# Patient Record
Sex: Male | Born: 1975 | Race: Black or African American | Hispanic: No | Marital: Married | Smoking: Current every day smoker
Health system: Southern US, Community
[De-identification: ages and names within clinical notes are randomized; demographics above are authoritative.]

---

## 2013-09-21 ENCOUNTER — Encounter (HOSPITAL_COMMUNITY): Payer: Self-pay | Admitting: Emergency Medicine

## 2013-09-21 ENCOUNTER — Emergency Department (HOSPITAL_COMMUNITY): Payer: Self-pay

## 2013-09-21 ENCOUNTER — Emergency Department (HOSPITAL_COMMUNITY)
Admission: EM | Admit: 2013-09-21 | Discharge: 2013-09-21 | Disposition: A | Discharge status: Home / Self Care | Payer: Self-pay | Attending: Emergency Medicine | Admitting: Emergency Medicine

## 2013-09-21 DIAGNOSIS — R0602 Shortness of breath: Secondary | ICD-10-CM | POA: Insufficient documentation

## 2013-09-21 DIAGNOSIS — R55 Syncope and collapse: Secondary | ICD-10-CM | POA: Insufficient documentation

## 2013-09-21 DIAGNOSIS — R5383 Other fatigue: Secondary | ICD-10-CM

## 2013-09-21 DIAGNOSIS — R42 Dizziness and giddiness: Secondary | ICD-10-CM | POA: Insufficient documentation

## 2013-09-21 DIAGNOSIS — R110 Nausea: Secondary | ICD-10-CM | POA: Insufficient documentation

## 2013-09-21 DIAGNOSIS — F17200 Nicotine dependence, unspecified, uncomplicated: Secondary | ICD-10-CM | POA: Insufficient documentation

## 2013-09-21 DIAGNOSIS — IMO0001 Reserved for inherently not codable concepts without codable children: Secondary | ICD-10-CM | POA: Insufficient documentation

## 2013-09-21 DIAGNOSIS — R5381 Other malaise: Secondary | ICD-10-CM | POA: Insufficient documentation

## 2013-09-21 LAB — URINALYSIS, ROUTINE W REFLEX MICROSCOPIC
Bilirubin Urine: NEGATIVE
Glucose, UA: NEGATIVE mg/dL
Hgb urine dipstick: NEGATIVE
KETONES UR: NEGATIVE mg/dL
Leukocytes, UA: NEGATIVE
Nitrite: NEGATIVE
Protein, ur: NEGATIVE mg/dL
Specific Gravity, Urine: 1.007 (ref 1.005–1.030)
Urobilinogen, UA: 0.2 mg/dL (ref 0.0–1.0)
pH: 7 (ref 5.0–8.0)

## 2013-09-21 LAB — CBC WITH DIFFERENTIAL/PLATELET
BASOS PCT: 1 % (ref 0–1)
Basophils Absolute: 0 10*3/uL (ref 0.0–0.1)
EOS PCT: 2 % (ref 0–5)
Eosinophils Absolute: 0.1 10*3/uL (ref 0.0–0.7)
HEMATOCRIT: 44.9 % (ref 39.0–52.0)
HEMOGLOBIN: 15.8 g/dL (ref 13.0–17.0)
Lymphocytes Relative: 26 % (ref 12–46)
Lymphs Abs: 1.7 10*3/uL (ref 0.7–4.0)
MCH: 31.8 pg (ref 26.0–34.0)
MCHC: 35.2 g/dL (ref 30.0–36.0)
MCV: 90.3 fL (ref 78.0–100.0)
MONO ABS: 0.4 10*3/uL (ref 0.1–1.0)
MONOS PCT: 7 % (ref 3–12)
Neutro Abs: 4.2 10*3/uL (ref 1.7–7.7)
Neutrophils Relative %: 65 % (ref 43–77)
Platelets: 231 10*3/uL (ref 150–400)
RBC: 4.97 MIL/uL (ref 4.22–5.81)
RDW: 12 % (ref 11.5–15.5)
WBC: 6.5 10*3/uL (ref 4.0–10.5)

## 2013-09-21 LAB — COMPREHENSIVE METABOLIC PANEL
ALBUMIN: 4.4 g/dL (ref 3.5–5.2)
ALT: 75 U/L — ABNORMAL HIGH (ref 0–53)
AST: 40 U/L — ABNORMAL HIGH (ref 0–37)
Alkaline Phosphatase: 68 U/L (ref 39–117)
BILIRUBIN TOTAL: 0.6 mg/dL (ref 0.3–1.2)
BUN: 11 mg/dL (ref 6–23)
CHLORIDE: 100 meq/L (ref 96–112)
CO2: 25 mEq/L (ref 19–32)
CREATININE: 1.11 mg/dL (ref 0.50–1.35)
Calcium: 9.7 mg/dL (ref 8.4–10.5)
GFR calc Af Amer: >90 mL/min (ref >90)
GFR calc non Af Amer: 83 mL/min — ABNORMAL LOW (ref >90)
Glucose, Bld: 107 mg/dL — ABNORMAL HIGH (ref 70–99)
Potassium: 3.6 mEq/L — ABNORMAL LOW (ref 3.7–5.3)
Sodium: 138 mEq/L (ref 137–147)
Total Protein: 7.4 g/dL (ref 6.0–8.3)

## 2013-09-21 LAB — TROPONIN I

## 2013-09-21 MED ORDER — LORAZEPAM 2 MG/ML IJ SOLN
1.0000 mg | Freq: Once | INTRAMUSCULAR | Status: AC
  Administered 2013-09-21: 1 mg via INTRAVENOUS
  Filled 2013-09-21: qty 1

## 2013-09-21 MED ORDER — ACETAMINOPHEN 500 MG PO TABS
1000.0000 mg | ORAL_TABLET | Freq: Once | ORAL | Status: AC
  Administered 2013-09-21: 1000 mg via ORAL
  Filled 2013-09-21: qty 2

## 2013-09-21 MED ORDER — MECLIZINE HCL 25 MG PO TABS: 25.0000 mg | ORAL_TABLET | Freq: Three times a day (TID) | ORAL | Status: AC | PRN

## 2013-09-21 MED ORDER — DIAZEPAM 5 MG/ML IJ SOLN
5.0000 mg | Freq: Once | INTRAMUSCULAR | Status: AC
  Administered 2013-09-21: 5 mg via INTRAVENOUS
  Filled 2013-09-21: qty 2

## 2013-09-21 MED ORDER — KETOROLAC TROMETHAMINE 30 MG/ML IJ SOLN
30.0000 mg | Freq: Once | INTRAMUSCULAR | Status: AC
  Administered 2013-09-21: 30 mg via INTRAVENOUS
  Filled 2013-09-21: qty 1

## 2013-09-21 MED ORDER — SODIUM CHLORIDE 0.9 % IV BOLUS (SEPSIS)
1000.0000 mL | Freq: Once | INTRAVENOUS | Status: AC
  Administered 2013-09-21: 1000 mL via INTRAVENOUS

## 2013-09-21 MED ORDER — DIAZEPAM 5 MG PO TABS: 5.0000 mg | ORAL_TABLET | Freq: Four times a day (QID) | ORAL | Status: AC | PRN

## 2013-09-21 NOTE — Discharge Instructions (Signed)
Near-Syncope Near-syncope (commonly known as near fainting) is sudden weakness, dizziness, or feeling like you might pass out. During an episode of near-syncope, you may also develop pale skin, have tunnel vision, or feel sick to your stomach (nauseous). Near-syncope may occur when getting up after sitting or while standing for a long time. It is caused by a sudden decrease in blood flow to the brain. This decrease can result from various causes or triggers, most of which are not serious. However, because near-syncope can sometimes be a sign of something serious, a medical evaluation is required. The specific cause is often not determined. HOME CARE INSTRUCTIONS  Monitor your condition for any changes. The following actions may help to alleviate any discomfort you are experiencing:  Have someone stay with you until you feel stable.  Lie down right away if you start feeling like you might faint. Breathe deeply and steadily. Wait until all the symptoms have passed. Most of these episodes last only a few minutes. You may feel tired for several hours.   Drink enough fluids to keep your urine clear or pale yellow.   If you are taking blood pressure or heart medicine, get up slowly when seated or lying down. Take several minutes to sit and then stand. This can reduce dizziness.  Follow up with your health care provider as directed. SEEK IMMEDIATE MEDICAL CARE IF:   You have a severe headache.   You have unusual pain in the chest, abdomen, or back.   You are bleeding from the mouth or rectum, or you have black or tarry stool.   You have an irregular or very fast heartbeat.   You have repeated fainting or have seizure-like jerking during an episode.   You faint when sitting or lying down.   You have confusion.   You have difficulty walking.   You have severe weakness.   You have vision problems.  MAKE SURE YOU:   Understand these instructions.  Will watch your  condition.  Will get help right away if you are not doing well or get worse. Document Released: 06/09/2005 Document Revised: 02/09/2013 Document Reviewed: 11/12/2012 [facility] Patient Information 2014 [address], [place].  Vertigo Vertigo means you feel like you or your surroundings are moving when they are not. Vertigo can be dangerous if it occurs when you are at work, driving, or performing difficult activities.  CAUSES  Vertigo occurs when there is a conflict of signals sent to your brain from the visual and sensory systems in your body. There are many different causes of vertigo, including:  Infections, especially in the inner ear.  A bad reaction to a drug or misuse of alcohol and medicines.  Withdrawal from drugs or alcohol.  Rapidly changing positions, such as lying down or rolling over in bed.  A migraine headache.  Decreased blood flow to the brain.  Increased pressure in the brain from a head injury, infection, tumor, or bleeding. SYMPTOMS  You may feel as though the world is spinning around or you are falling to the ground. Because your balance is upset, vertigo can cause nausea and vomiting. You may have involuntary eye movements (nystagmus). DIAGNOSIS  Vertigo is usually diagnosed by physical exam. If the cause of your vertigo is unknown, your caregiver may perform imaging tests, such as an MRI scan (magnetic resonance imaging). TREATMENT  Most cases of vertigo resolve on their own, without treatment. Depending on the cause, your caregiver may prescribe certain medicines. If your vertigo is related to body  position issues, your caregiver may recommend movements or procedures to correct the problem. In rare cases, if your vertigo is caused by certain inner ear problems, you may need surgery. HOME CARE INSTRUCTIONS   Follow your caregiver's instructions.  Avoid driving.  Avoid operating heavy machinery.  Avoid performing any tasks that would be dangerous to you or others  during a vertigo episode.  Tell your caregiver if you notice that certain medicines seem to be causing your vertigo. Some of the medicines used to treat vertigo episodes can actually make them worse in some people. SEEK IMMEDIATE MEDICAL CARE IF:   Your medicines do not relieve your vertigo or are making it worse.  You develop problems with talking, walking, weakness, or using your arms, hands, or legs.  You develop severe headaches.  Your nausea or vomiting continues or gets worse.  You develop visual changes.  A family member notices behavioral changes.  Your condition gets worse. MAKE SURE YOU:  Understand these instructions.  Will watch your condition.  Will get help right away if you are not doing well or get worse. Document Released: 03/19/2005 Document Revised: 09/01/2011 Document Reviewed: 12/26/2010 [facility] Patient Information 2014 [place], [place].

## 2013-09-21 NOTE — ED Notes (Addendum)
Per pt report: [place] started to feel achy and nauseous. Pt reports he wanted to pass out.  Pt is unsure if he passed out or not.  Pt is dizzy right now and claims that it feels like the pt is spinning.  Pt a/o x 4.  Skin warm and dry. Pt denies SOB or fever.  Pt endorses chills and nausea. Pt reports using marijuana today.

## 2013-09-21 NOTE — ED Notes (Signed)
Pt. Complains of dizzy. pt. Advised to stay in bed.

## 2013-09-21 NOTE — Progress Notes (Signed)
   CARE MANAGEMENT ED NOTE 09/21/2013  Patient:  Matthew Choi,Matthew Choi   Account Number:  [id number]  Date Initiated:  09/21/2013  Documentation initiated by:  Radford PaxFERRERO,Johari Pinney  Subjective/Objective Assessment:   Patient presents to Ed c/o nausea and dizzy     Subjective/Objective Assessment Detail:     Action/Plan:   Action/Plan Detail:   Anticipated DC Date:       Status Recommendation to Physician:   Result of Recommendation:    Other ED Services  Consult Working Plan    DC Planning Services  Other  PCP issues    Choice offered to / List presented to:            Status of service:  Completed, signed off  ED Comments:   ED Comments Detail:  EDCM spoke to patient at bedside.  Patient reports [place] not have insurance [place] [facility] [place]  Patient reports his pcp is Dr. Ether GriffinsFowler.  [facility] provided patient with phone number for Affordable Care Act to inquire about insurnace.  Also provided patient with RX discount card and list of discounted pharmacies.  Patient thankful for resources.  No furhter EDCM needs at this time.

## 2013-09-24 NOTE — ED Provider Notes (Signed)
CSN: [id number]     Arrival date & time 09/21/13  1946 History   First MD Initiated Contact with Patient 09/21/13 2009     Chief Complaint  Patient presents with  . Near Syncope  . Nausea     (Consider location/radiation/quality/duration/timing/severity/associated sxs/prior Treatment) HPI Comments: Was driving and felt like he was going to pass out. Felt flushed, then tingling all over. Became very dizzy, felt short of breath. Now has aching pain in all his muscles. No chest pain.   Patient is a 38 y.o. male presenting with near-syncope.  Near Syncope Associated symptoms include shortness of breath. Pertinent negatives include no chest pain.    History reviewed. No pertinent past medical history. History reviewed. No pertinent past surgical history. No family history on file. History  Substance Use Topics  . Smoking status: Current Every Day Smoker    Types: Cigarettes  . Smokeless tobacco: Not on file  . Alcohol Use: 4.0 oz/week    8 drink(s) per week    Review of Systems  Constitutional: Positive for fatigue.  Respiratory: Positive for shortness of breath.   Cardiovascular: Positive for near-syncope. Negative for chest pain.  Musculoskeletal: Positive for myalgias.  Neurological: Positive for dizziness.  All other systems reviewed and are negative.      Allergies  Review of patient's allergies indicates no known allergies.  Home Medications   Current Outpatient Rx  Name  Route  Sig  Dispense  Refill  . guaiFENesin (MUCINEX) 600 MG 12 hr tablet   Oral   Take by mouth 2 (two) times daily as needed (cough).         . ondansetron (ZOFRAN) 4 MG tablet   Oral   Take 4 mg by mouth every 8 (eight) hours as needed for nausea or vomiting (nausea).         . diazepam (VALIUM) 5 MG tablet   Oral   Take 1 tablet (5 mg total) by mouth every 6 (six) hours as needed for anxiety (spasms).   10 tablet   0   . meclizine (ANTIVERT) 25 MG tablet   Oral   Take 1 tablet  (25 mg total) by mouth 3 (three) times daily as needed for dizziness.   30 tablet   0    BP 115/64  Pulse 70  Temp(Src) 98.1 F (36.7 C) (Oral)  Resp 16  SpO2 97% Physical Exam  Constitutional: He is oriented to person, place, and time. He appears well-developed and well-nourished. No distress.  HENT:  Head: Normocephalic and atraumatic.  Right Ear: Hearing normal.  Left Ear: Hearing normal.  Nose: Nose normal.  Mouth/Throat: Oropharynx is clear and moist and mucous membranes are normal.  Eyes: Conjunctivae and EOM are normal. Pupils are equal, round, and reactive to light.  Neck: Normal range of motion. Neck supple.  Cardiovascular: Regular rhythm, S1 normal and S2 normal.  Exam reveals no gallop and no friction rub.   No murmur heard. Pulmonary/Chest: Effort normal and breath sounds normal. No respiratory distress. He exhibits no tenderness.  Abdominal: Soft. Normal appearance and bowel sounds are normal. There is no hepatosplenomegaly. There is no tenderness. There is no rebound, no guarding, no tenderness at McBurney's point and negative Murphy's sign. No hernia.  Musculoskeletal: Normal range of motion.  Neurological: He is alert and oriented to person, place, and time. He has normal strength. No cranial nerve deficit or sensory deficit. Coordination normal. GCS eye subscore is 4. GCS verbal subscore is 5. GCS  motor subscore is 6.  Skin: Skin is warm, dry and intact. No rash noted. No cyanosis.  Psychiatric: He has a normal mood and affect. His speech is normal and behavior is normal. Thought content normal.    ED Course  Procedures (including critical care time) Labs Review Labs Reviewed  COMPREHENSIVE METABOLIC PANEL - Abnormal; Notable for the following:    Potassium 3.6 (*)    Glucose, Bld 107 (*)    AST 40 (*)    ALT 75 (*)    GFR calc non Af Amer 83 (*)    All other components within normal limits  CBC WITH DIFFERENTIAL  TROPONIN I  URINALYSIS, ROUTINE W REFLEX  MICROSCOPIC   Imaging Review No results found.   EKG Interpretation   Date/Time:  Wednesday September 21 2013 19:54:14 EDT Ventricular Rate:  76 PR Interval:  172 QRS Duration: 92 QT Interval:  398 QTC Calculation: 447 R Axis:   -12 Text Interpretation:  Sinus rhythm Probable left atrial enlargement No  previous tracing Confirmed by POLLINA  MD, CHRISTOPHER [phone]) on 09/21/2013  8:10:05 PM      MDM   Final diagnoses:  Vertigo  Near syncope   Patient with near syncope. Sounds convincing for panic attack. Patient extremely anxious in ER, improved with valium. Cardiac workup normal. CT head normal. Normal neuro exam. Vitals normal, no shortness of breath currently. PE felt to be unlikely. Discussed possibility of neuro etiology (seizure, MS) and need for PCP follow up.    Gilda Creasehristopher J. Pollina, MD 09/24/13 [phone]

## 2015-06-19 IMAGING — CR DG CHEST 2V
2 series · 2 of 2 positions shown · non-contrast
Comparison: None.

CLINICAL DATA: Fever, chest pain

EXAM:
CHEST  2 VIEW

[w chest pa]
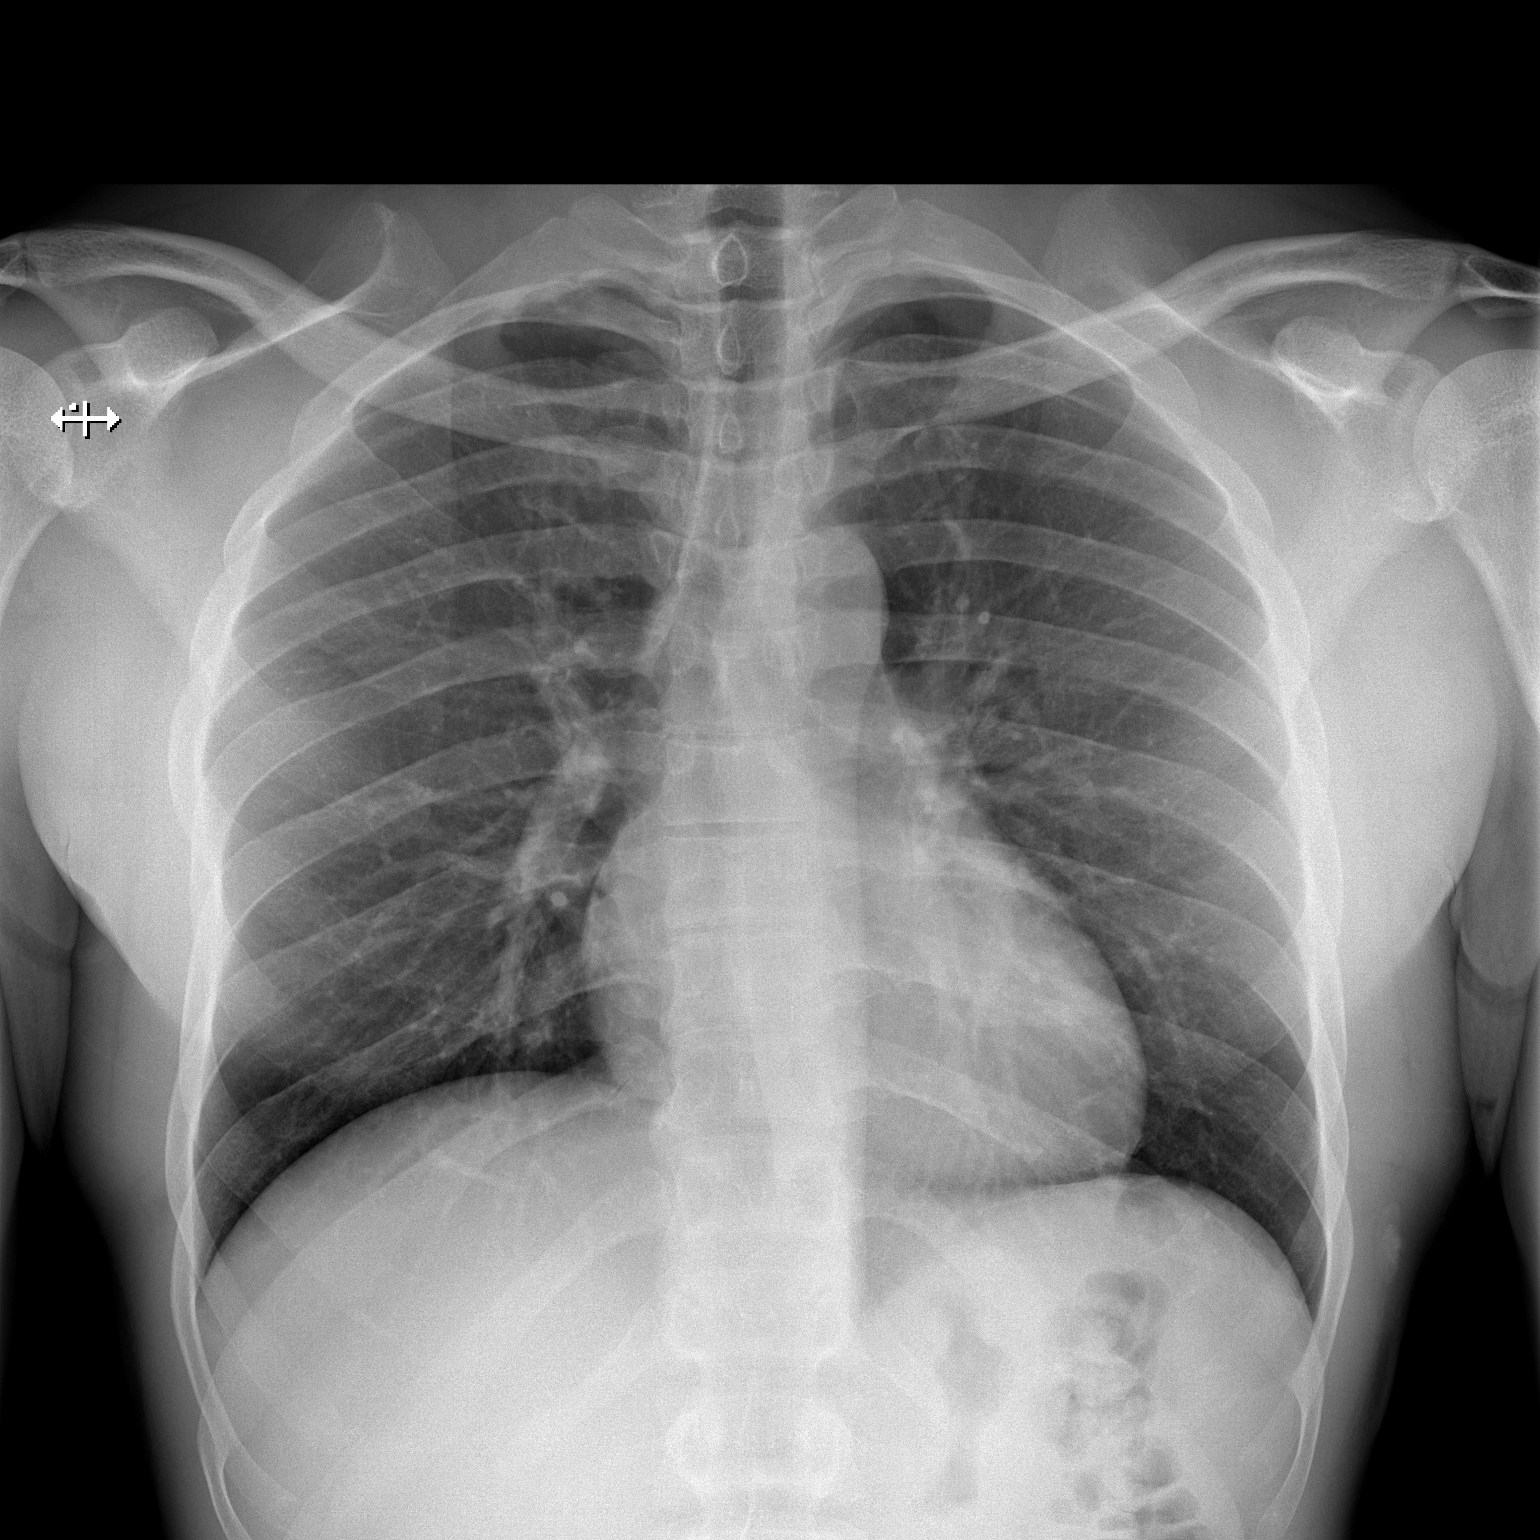

[w chest lat]
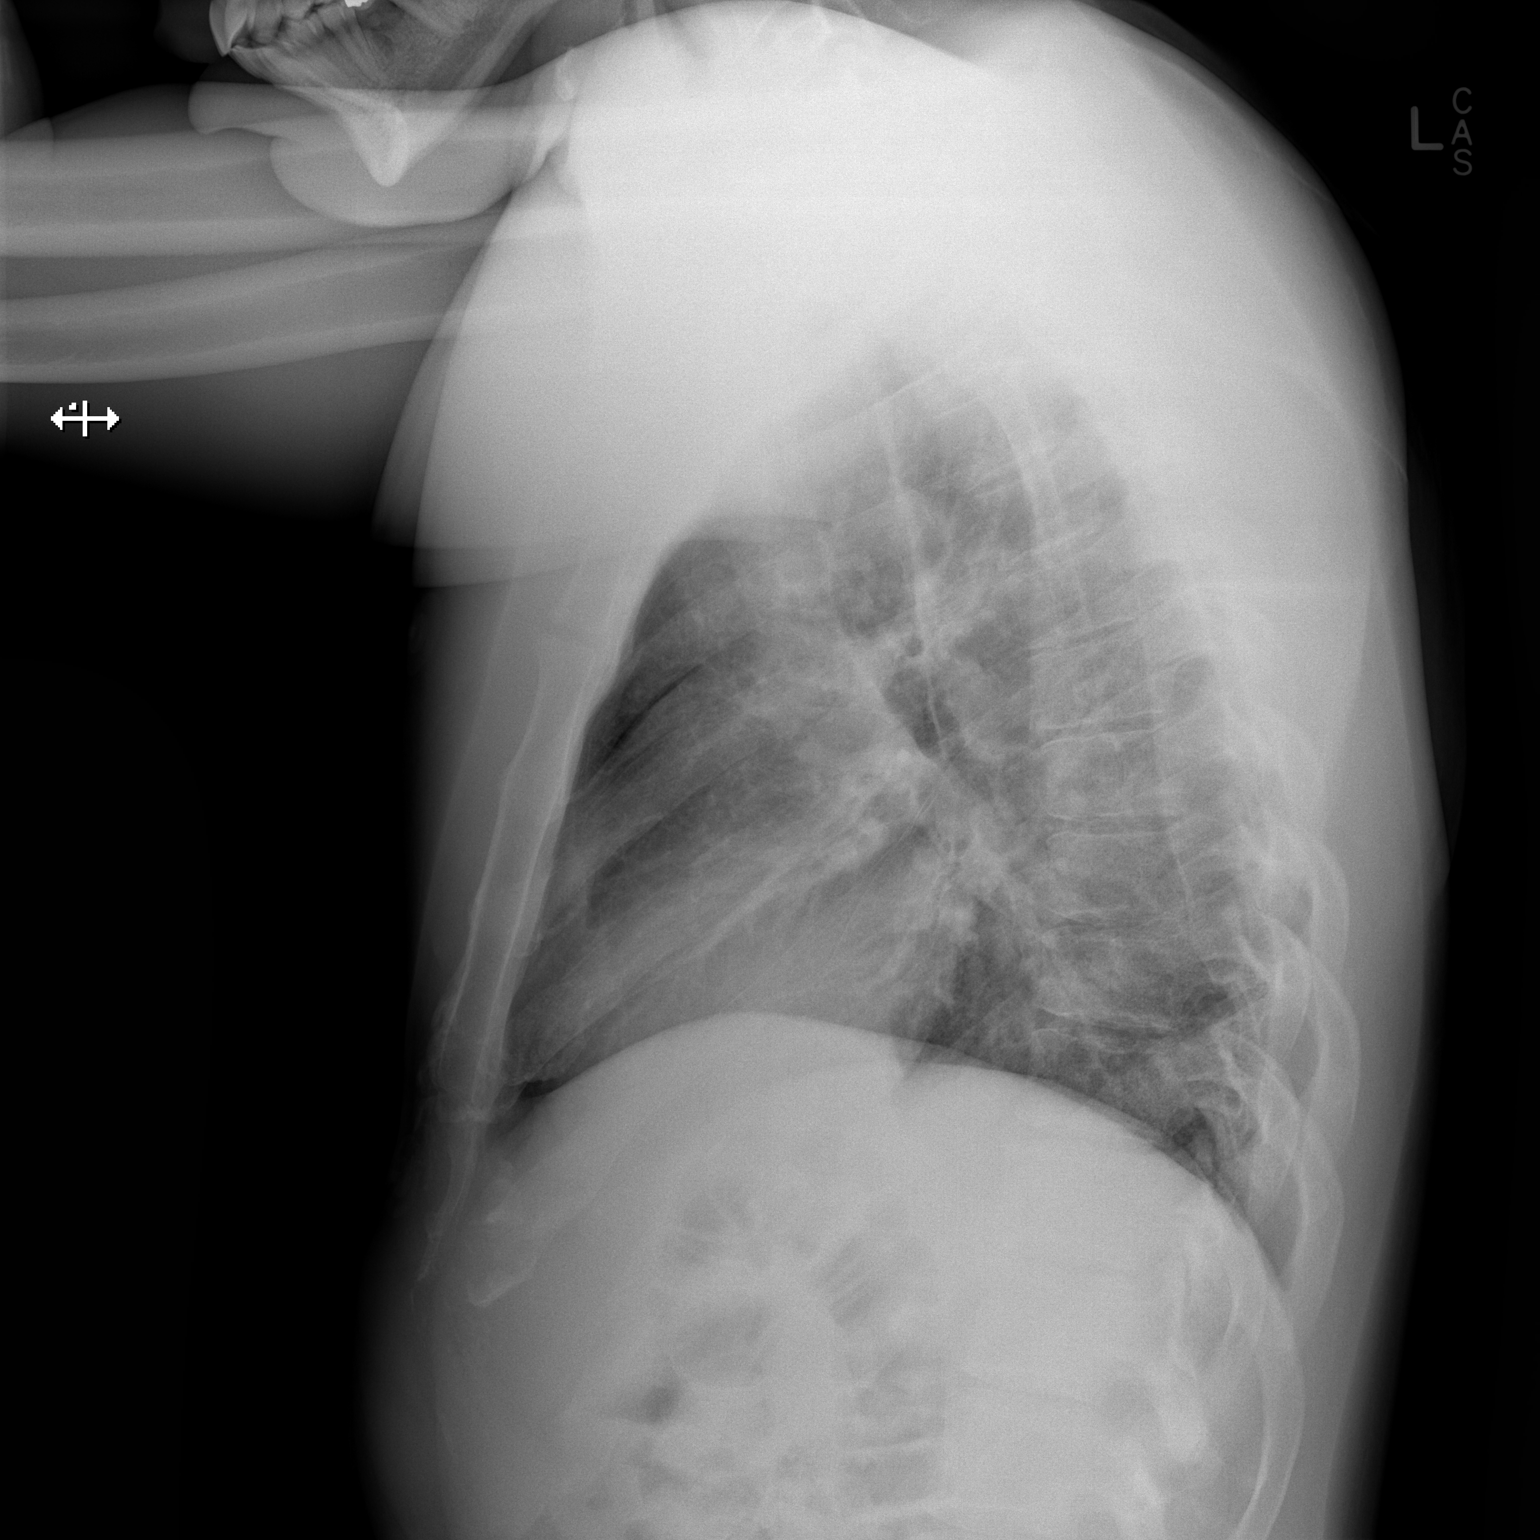

[2 of 2 positions shown; findings below may reference images not displayed]

FINDINGS: The lungs are clear and negative for focal airspace consolidation,
pulmonary edema or suspicious pulmonary nodule. No pleural effusion
or pneumothorax. Cardiac and mediastinal contours are within normal
limits. No acute fracture or lytic or blastic osseous lesions. The
visualized upper abdominal bowel gas pattern is unremarkable.
IMPRESSION: No active cardiopulmonary disease.

## 2015-06-19 IMAGING — CT CT HEAD W/O CM
2 series · 15 of 30 positions shown, 19 images · non-contrast
Comparison: None.

CLINICAL DATA: Dizziness and nausea.  Question of syncope.

EXAM:
CT HEAD WITHOUT CONTRAST
TECHNIQUE: Contiguous axial images were obtained from the base of the skull
through the vertex without intravenous contrast.

[Series 2: head w/o · axial · non-contrast · 0.48mm/px · z∈[-141,-11]mm · 13 of 32 slices shown, 17 images]
[im 3/32  brain]
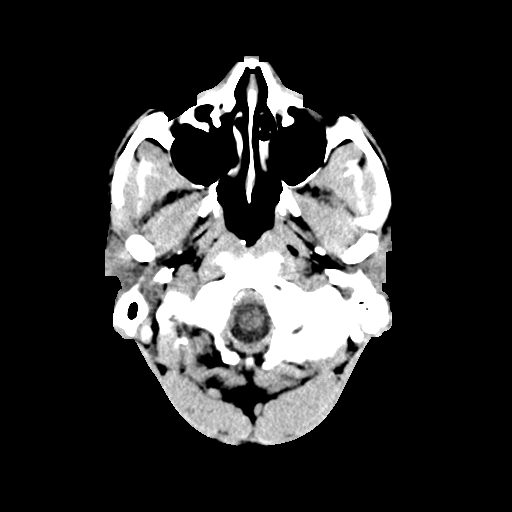
[im 3/32  bone]
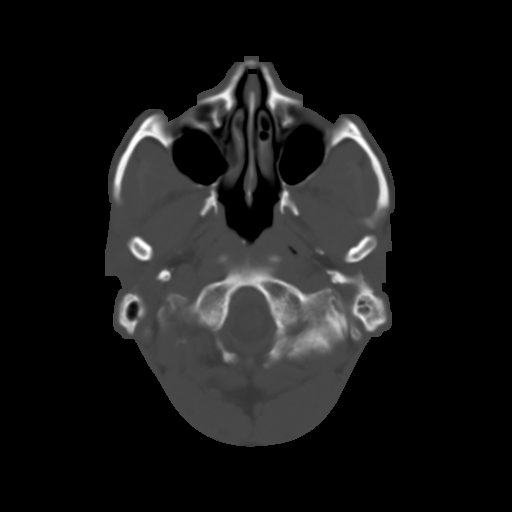
[im 5/32  brain]
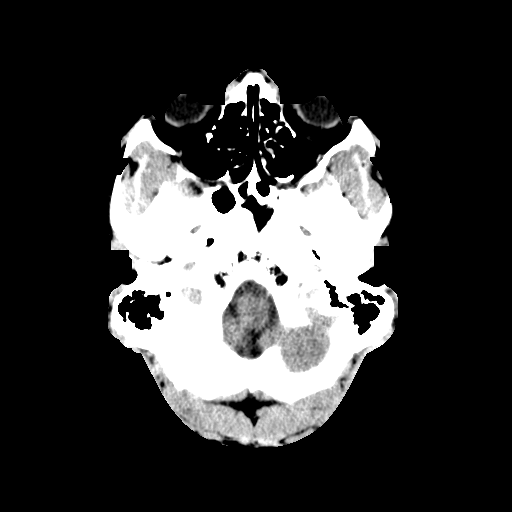
[im 7/32  brain]
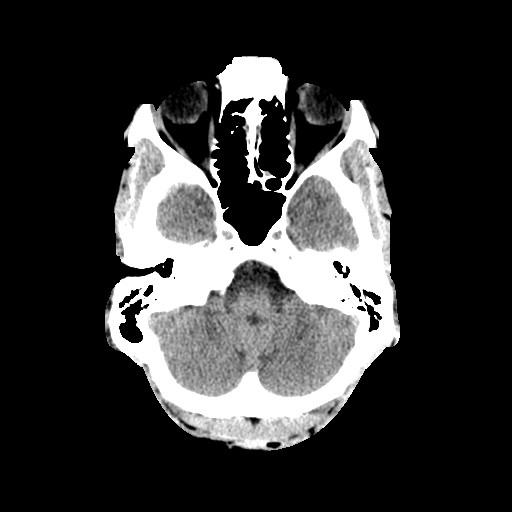
[im 9/32  brain]
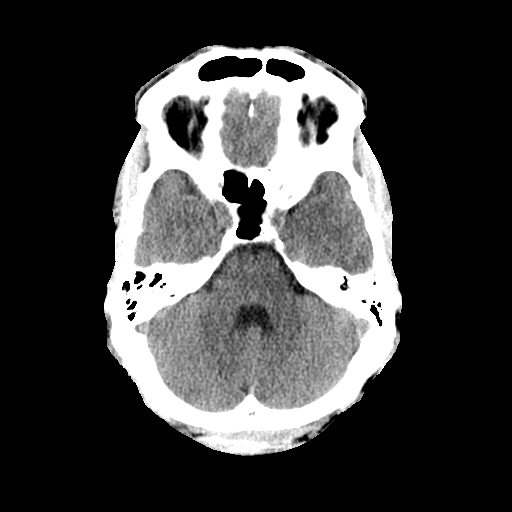
[im 12/32  brain]
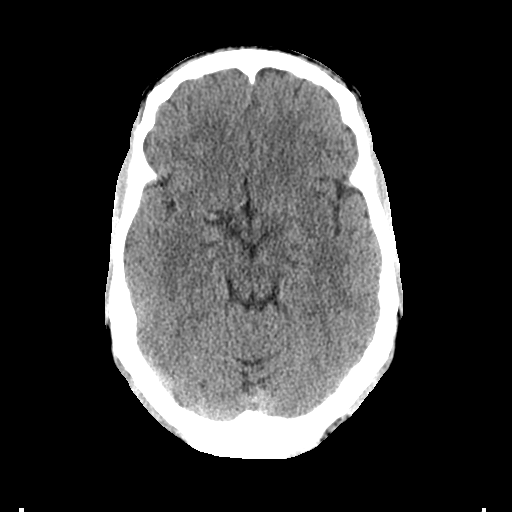
[im 12/32  bone]
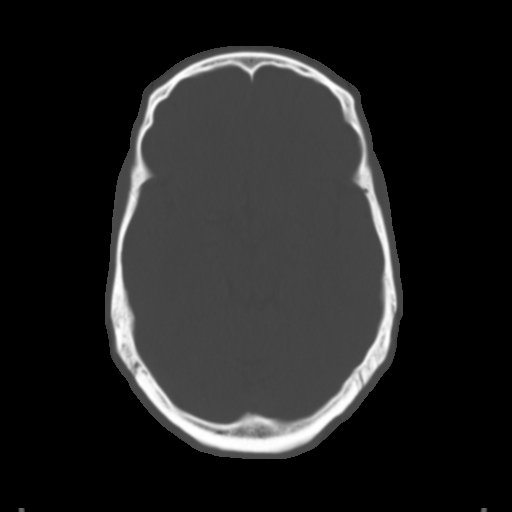
[im 14/32  brain]
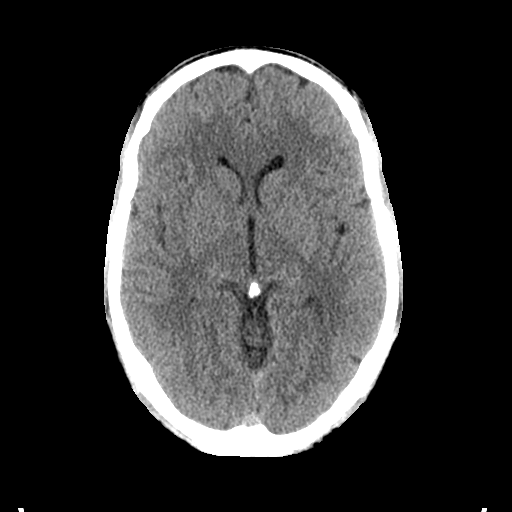
[im 16/32  brain]
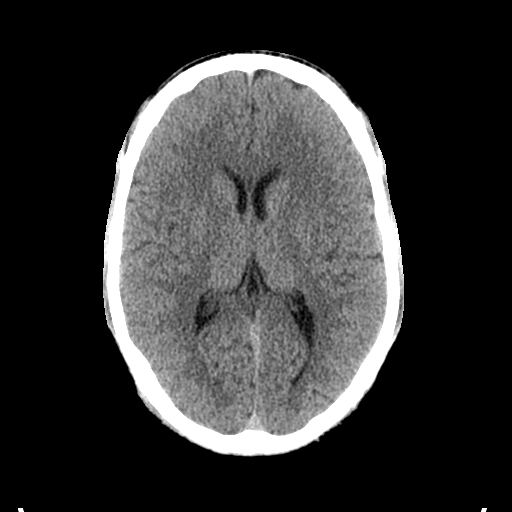
[im 18/32  brain]
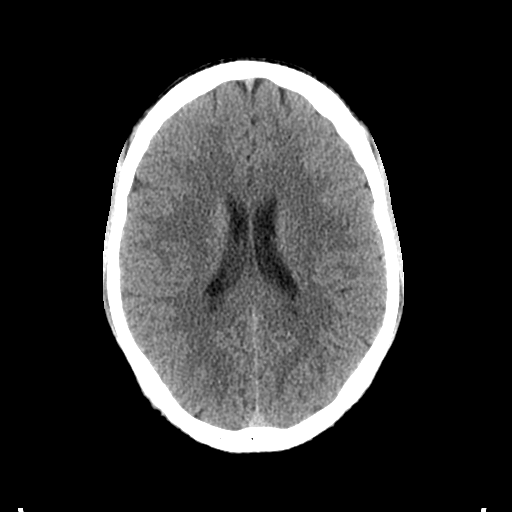
[im 20/32  brain]
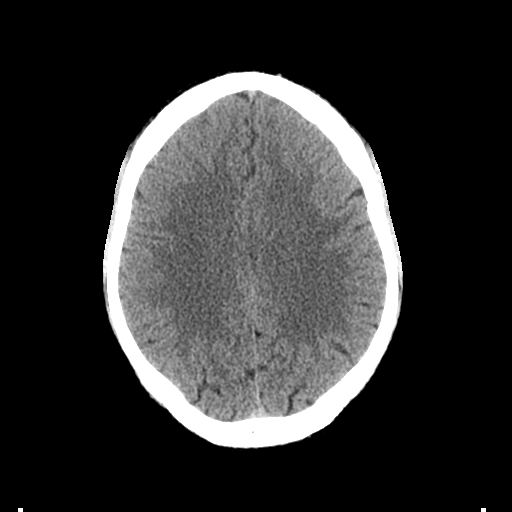
[im 20/32  bone]
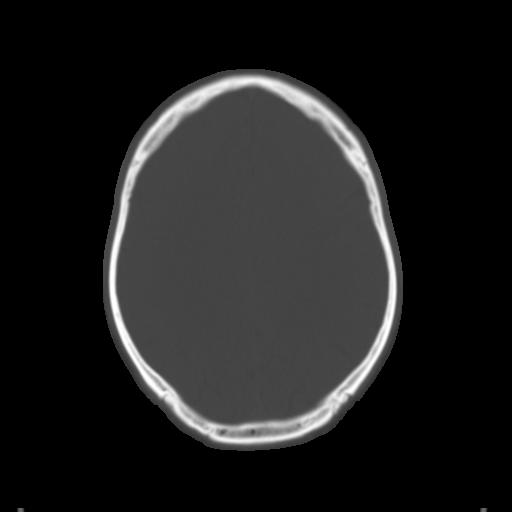
[im 23/32  brain]
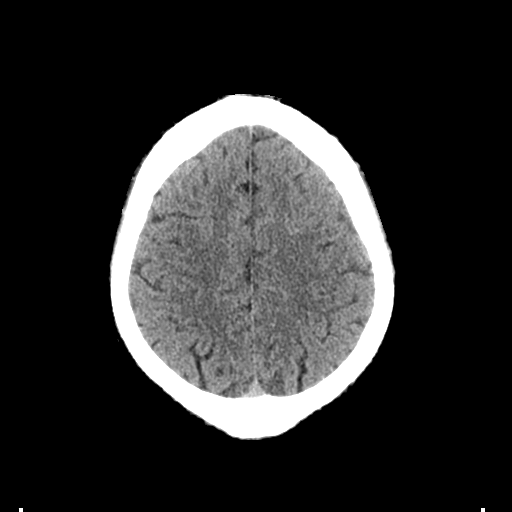
[im 25/32  brain]
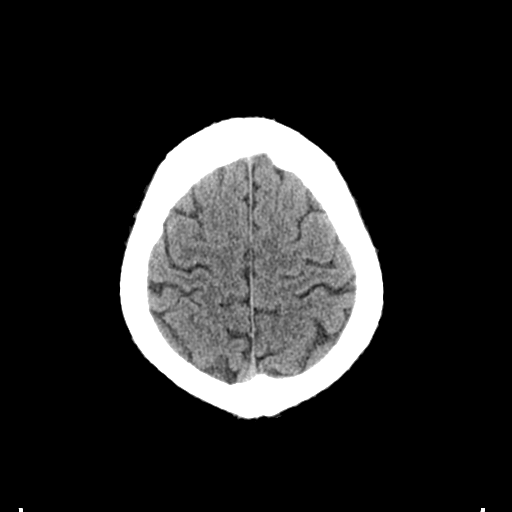
[im 27/32  brain]
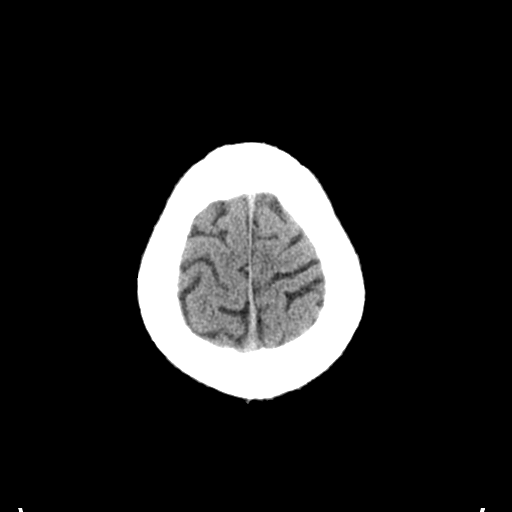
[im 29/32  brain]
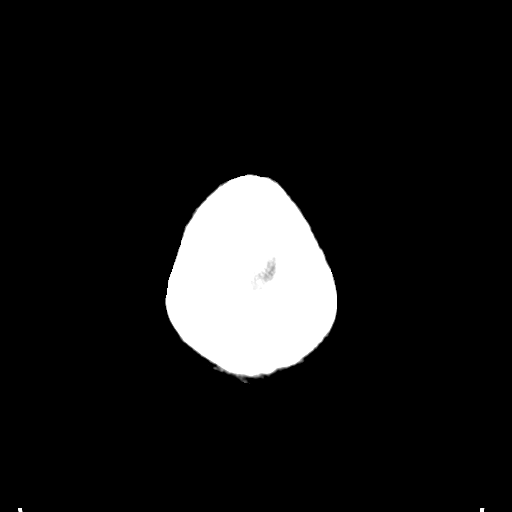
[im 29/32  bone]
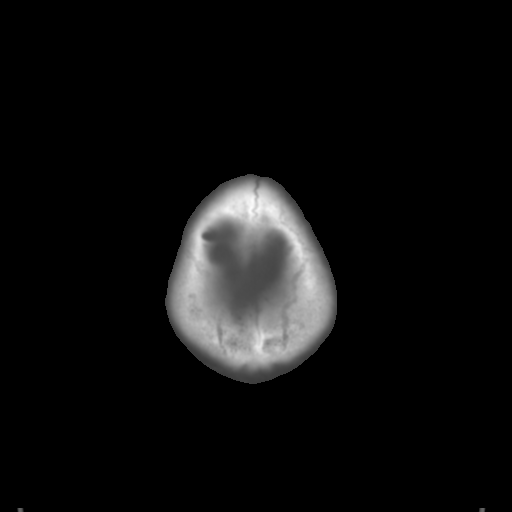

[Series 3: bone windows · axial · 0.48mm/px · z∈[-141,-121]mm · 2 of 32 slices shown]
[im 3/32  bone]
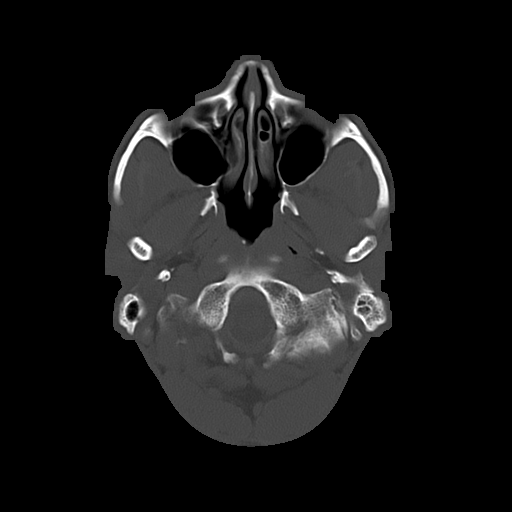
[im 7/32  bone]
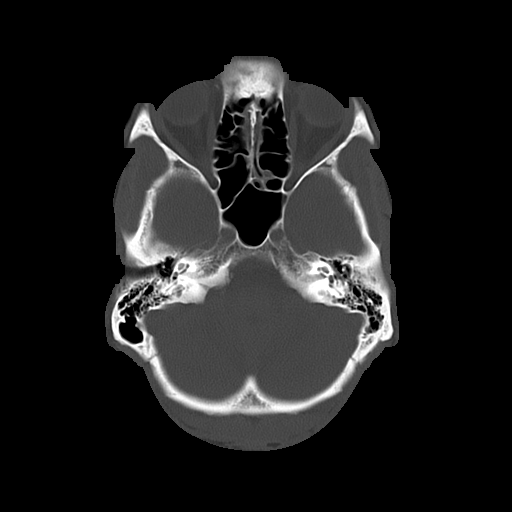

[15 of 30 positions shown; findings below may reference images not displayed]

FINDINGS: There is no evidence of acute infarction, mass lesion, or intra- or
extra-axial hemorrhage on CT.

The posterior fossa, including the cerebellum, brainstem and fourth
ventricle, is within normal limits. The third and lateral
ventricles, and basal ganglia are unremarkable in appearance. The
cerebral hemispheres are symmetric in appearance, with normal
gray-white differentiation. No mass effect or midline shift is seen.

There is no evidence of fracture; visualized osseous structures are
unremarkable in appearance. The orbits are within normal limits. The
paranasal sinuses and mastoid air cells are well-aerated. No
significant soft tissue abnormalities are seen.
IMPRESSION: Unremarkable noncontrast CT of the head.
